# Patient Record
Sex: Male | Born: 1960 | Race: White | Hispanic: No | Marital: Married
Health system: Southern US, Community
[De-identification: ages and names within clinical notes are randomized; demographics above are authoritative.]

---

## 2007-01-11 ENCOUNTER — Emergency Department: Payer: Self-pay | Admitting: Emergency Medicine

## 2007-12-24 ENCOUNTER — Ambulatory Visit: Payer: Self-pay | Admitting: Family Medicine

## 2008-05-16 ENCOUNTER — Emergency Department: Payer: Self-pay | Admitting: Emergency Medicine

## 2010-03-07 ENCOUNTER — Ambulatory Visit: Payer: Self-pay | Admitting: Internal Medicine

## 2010-03-27 ENCOUNTER — Ambulatory Visit: Payer: Self-pay | Admitting: Surgery

## 2012-05-22 ENCOUNTER — Emergency Department: Payer: Self-pay | Admitting: Emergency Medicine

## 2012-05-22 LAB — URINALYSIS, COMPLETE
Bacteria: NONE SEEN
Bilirubin,UR: NEGATIVE
Blood: NEGATIVE
Glucose,UR: NEGATIVE mg/dL (ref 0–75)
Ketone: NEGATIVE
Leukocyte Esterase: NEGATIVE
Nitrite: NEGATIVE
Ph: 5 (ref 4.5–8.0)
Protein: NEGATIVE
RBC,UR: 2 /HPF (ref 0–5)
Specific Gravity: 1.03 (ref 1.003–1.030)
Squamous Epithelial: NONE SEEN
WBC UR: 1 /HPF (ref 0–5)

## 2012-06-28 ENCOUNTER — Ambulatory Visit: Payer: Self-pay | Admitting: Surgery

## 2012-06-28 LAB — HEMOGLOBIN: HGB: 14.8 g/dL (ref 13.0–18.0)

## 2012-06-29 LAB — CBC WITH DIFFERENTIAL/PLATELET
Basophil #: 0 10*3/uL (ref 0.0–0.1)
Eosinophil #: 0 10*3/uL (ref 0.0–0.7)
HCT: 34 % — ABNORMAL LOW (ref 40.0–52.0)
HGB: 11.9 g/dL — ABNORMAL LOW (ref 13.0–18.0)
Lymphocyte #: 0.8 10*3/uL — ABNORMAL LOW (ref 1.0–3.6)
Lymphocyte %: 6.6 %
MCH: 29.5 pg (ref 26.0–34.0)
MCHC: 35.1 g/dL (ref 32.0–36.0)
Monocyte %: 6.3 %
Neutrophil %: 87 %
Platelet: 181 10*3/uL (ref 150–440)
RBC: 4.05 10*6/uL — ABNORMAL LOW (ref 4.40–5.90)

## 2012-09-22 IMAGING — CT CT CERVICAL SPINE WITHOUT CONTRAST
1 series · 12 of 14 positions shown, 15 images · non-contrast
Comparison: none

REASON FOR EXAM: head injury
COMMENTS:

[Series 5: axial · axial · 0.33mm/px · z∈[-286,-137]mm · 12 of 89 slices shown, 15 images]
[im 7/89  soft-tissue]
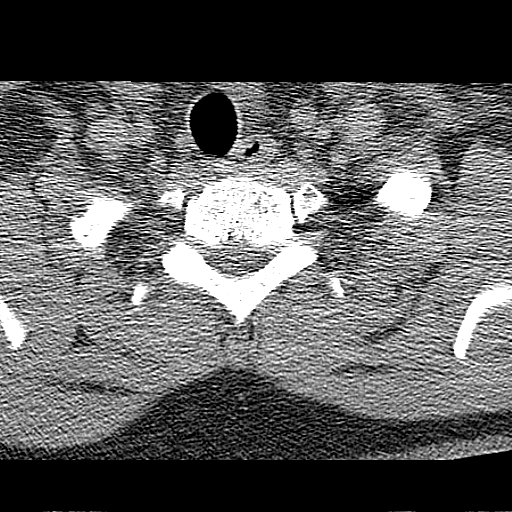
[im 7/89  bone]
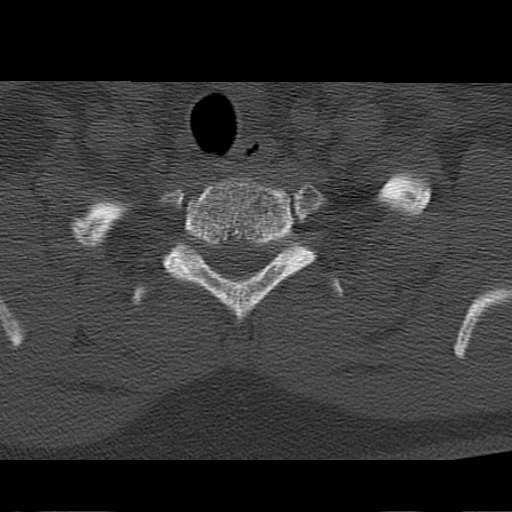
[im 14/89  bone]
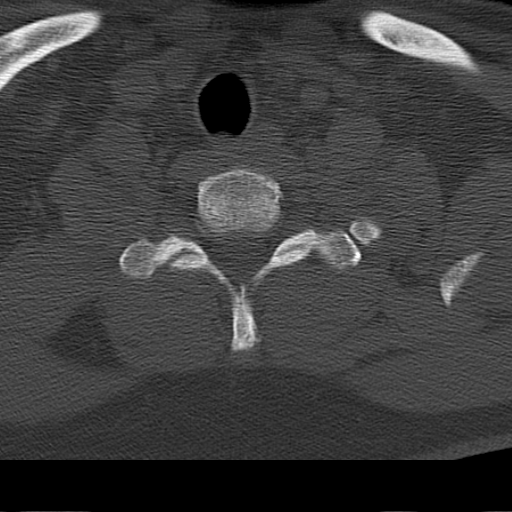
[im 21/89  bone]
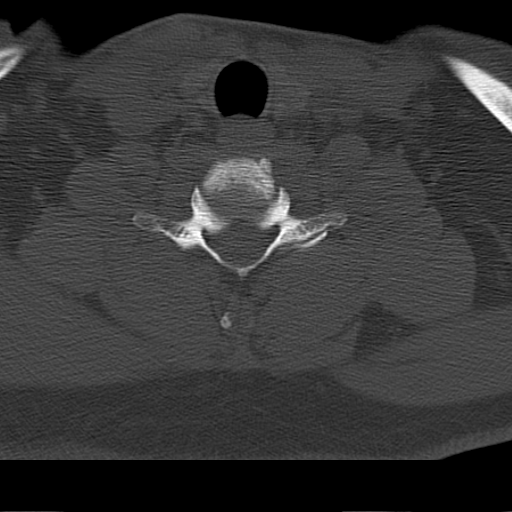
[im 28/89  bone]
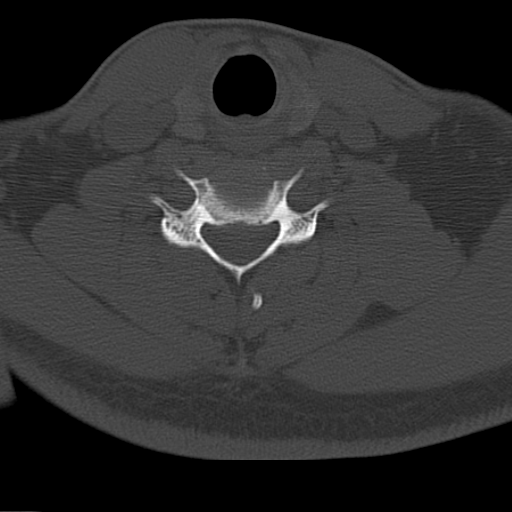
[im 34/89  soft-tissue]
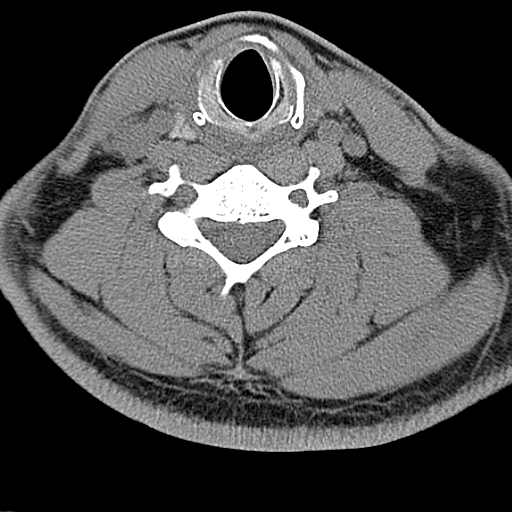
[im 34/89  bone]
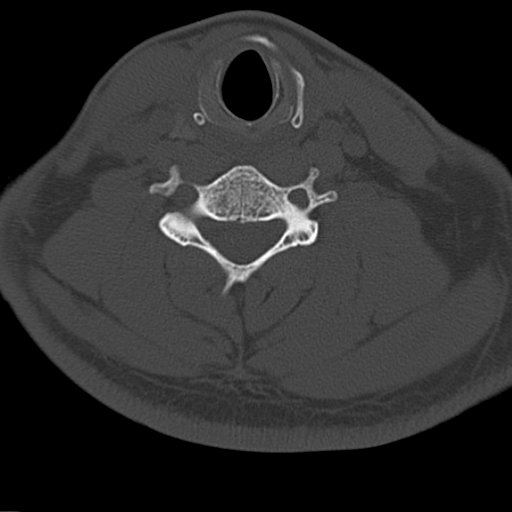
[im 41/89  bone]
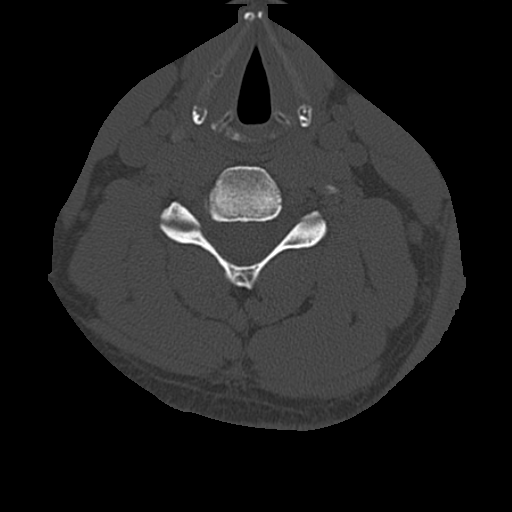
[im 48/89  bone]
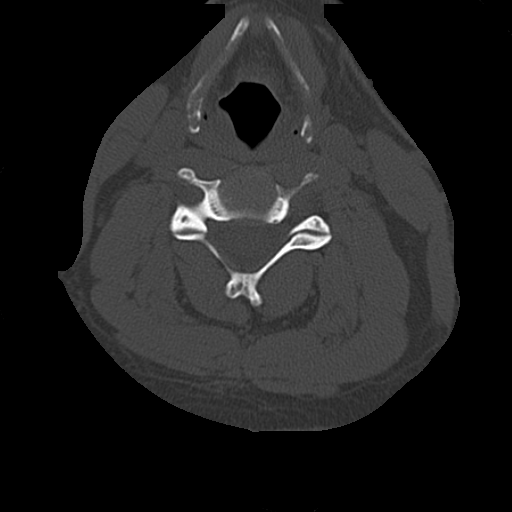
[im 55/89  bone]
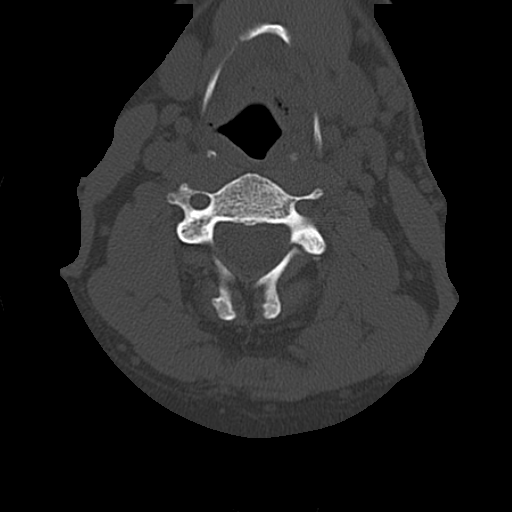
[im 61/89  soft-tissue]
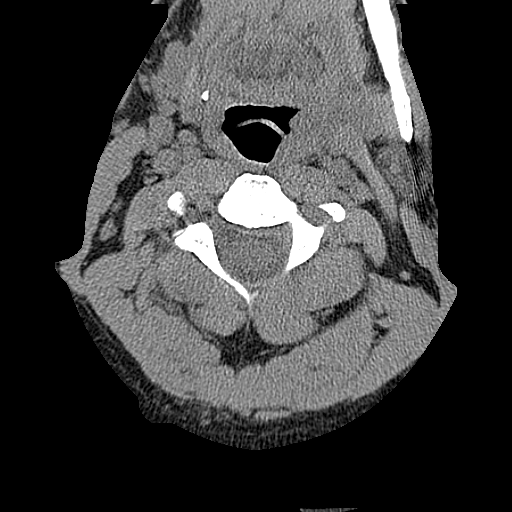
[im 61/89  bone]
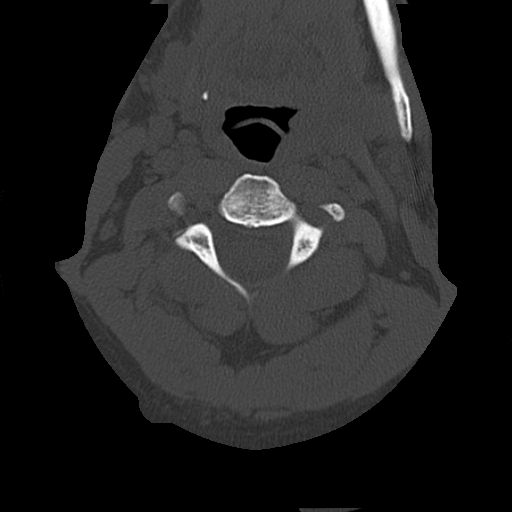
[im 68/89  bone]
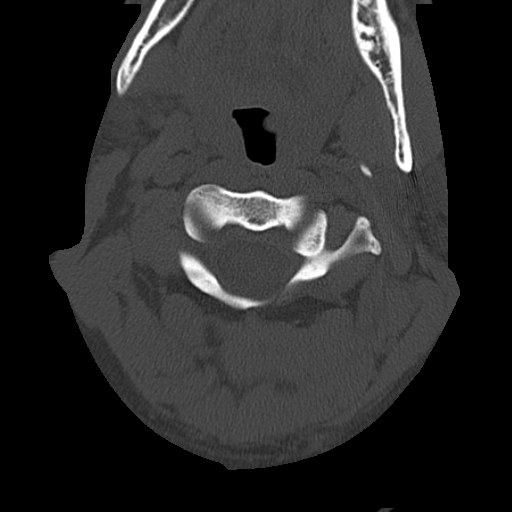
[im 75/89  bone]
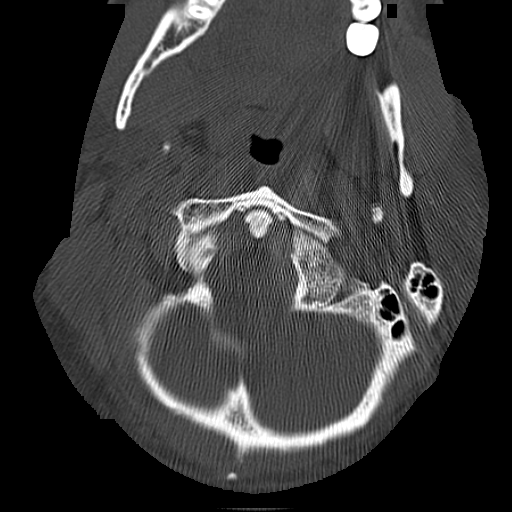
[im 82/89  bone]
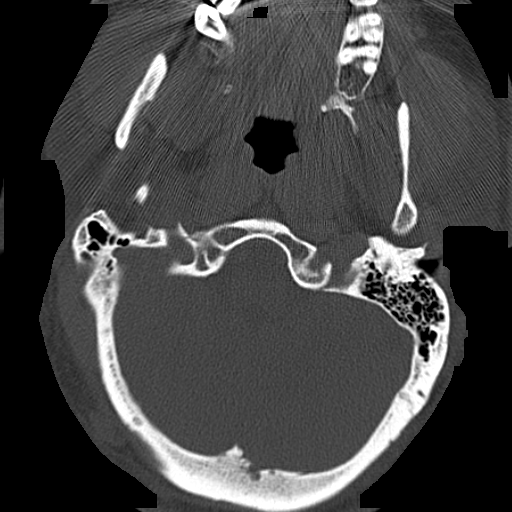

[12 of 14 positions shown; findings below may reference images not displayed]

PROCEDURE:     CT  - CT CERVICAL SPINE WO  - June 28, 2012  [DATE]

RESULT:     Multislice helical acquisition through the cervical spine is
reconstructed in the axial, coronal and sagittal planes at bone window
settings at 2.0 mm slice thickness. The patient has no previous exam for
comparison.

The spinal alignment is normal. The prevertebral soft tissues are normal.
There is no evidence of fracture or subluxation.
IMPRESSION: No CT evidence of acute cervical spine bony abnormality.

[REDACTED]

## 2013-02-06 ENCOUNTER — Emergency Department: Payer: Self-pay | Admitting: Emergency Medicine

## 2014-06-26 ENCOUNTER — Ambulatory Visit: Payer: Self-pay | Admitting: Unknown Physician Specialty

## 2014-07-03 LAB — PATHOLOGY REPORT

## 2015-03-06 NOTE — Op Note (Signed)
PATIENT NAME:  Shawn Potter, Shawn Potter MR#:  [id number] DATE OF BIRTH:  1961/04/18  DATE OF PROCEDURE:  06/28/2012  PREOPERATIVE DIAGNOSIS: Complex scalp laceration with hematoma.   POSTOPERATIVE DIAGNOSIS: Complex scalp laceration with hematoma.   PROCEDURES PERFORMED: Evacuation of hematoma, complex repair of scalp laceration greater than 10 cm in size, and pulsed lavage irrigation.   SURGEON: Natale LayMark Danica Camarena, M.D.   ASSISTANT: Wallace KellerLauren Weinberg, PA Student  TYPE OF ANESTHESIA: General oral endotracheal.   FINDINGS: There was a complex stellate laceration on the occiput of the scalp which had attempted previous repair in the emergency room earlier today resulting in a large subcutaneous hematoma. There was a small visible vein which was cauterized and suture ligated at its base. Hemostasis was subsequently obtained.   DESCRIPTION OF PROCEDURE: With the patient in the supine position, general oral endotracheal anesthesia was induced. The patient was then positioned and padded in lateral decubitus position, left side down, with help of axillary roll and airplane of flank of right arm. Beanbag was used. The existing dressing was removed. The wound was cleansed with sterile saline. The hair was generously clipped around the edges of the wound. The wound was then prepped and draped with Betadine solution with perioperative antibiotics being administered.   Time out was observed.   The existing sutures numbering at least three to four were taken with suture scissors. A large hematoma was evacuated. The wound was then irrigated. Point hemostasis was obtained. A total of 1 liter of lactated Ringer's on pulsed lavage irrigation was used to debride the wound. Skin edges were debrided sharply of devitalized tissue at the center of the stellate laceration. The flaps were then closed to the galea utilizing interrupted 2-0 Vicryl suture. Surgiflo with thrombin application was used in addition to pressure and cautery for  hemostasis. The wound was then closed in layered fashion with 3-0 and 2-0 Vicryl interrupted in the deep dermal layer attaching the dermis down to the galeal layers. Skin edges were then reapproximated utilizing a combination of vertical mattress and simple suture of 3-0 nylon along with a skin staple applier. A sterile dressing was then applied consisting of the remaining Surgiflo along the skin edges followed by 4 x 4's and Kerlix. The patient was then returned supine, extubated and taken to the recovery room in stable and satisfactory condition by anesthesia services. ____________________________ Redge GainerMark A. Egbert GaribaldiBird, MD mab:slb D: 06/28/2012 15:03:57 ET T: 06/28/2012 15:13:12 ET JOB#: [id number]  cc: Loraine LericheMark A. Egbert GaribaldiBird, MD, <Dictator> Reola MosherAndrew S. Randa LynnLamb, MD Ayvin Lipinski Kela MillinA Lakeasha Petion MD ELECTRONICALLY SIGNED 07/07/2012 15:03

## 2015-03-06 NOTE — Consult Note (Signed)
PATIENT NAME:  Shawn Potter, [name] MR#:  [id number] DATE OF BIRTH:  03-08-1961  DATE OF CONSULTATION:  06/28/2012  REFERRING PHYSICIAN:   CONSULTING PHYSICIAN:  Talin Feister A. Egbert GaribaldiBird, MD  HISTORY OF PRESENT ILLNESS: 54 year old white male involved in racquetball event earlier this morning fell backwards sustaining large complex laceration to his occiput. Brought by EMS to the Emergency Room. Hemodynamically stable. No loss of consciousness. CT scan reportedly is negative. Attempts at repair in the Emergency Room were unsuccessful due to continued hemorrhage. For this reason surgical services were consulted. Patient takes no blood thinners.   ALLERGIES: Penicillin.   MEDICATIONS: Diazepam and ibuprofen.   PAST MEDICAL HISTORY: Cholelithiasis.   PAST SURGICAL HISTORY:  1. Laparoscopic cholecystectomy in May 2011. 2. Knee surgery.   SOCIAL HISTORY: Does not smoke. Drinks occasionally. No heart or lung issues.   REVIEW OF SYSTEMS: As described above.   PHYSICAL EXAMINATION:  GENERAL: The patient was alert and oriented with his wife at bedside, cooperative.   VITAL SIGNS: Temperature 97.4, pulse 82, respiratory rate 18, blood pressure 157/86.   HEENT: There is a large amount of clotted blood in and around his head and neck with a large bandage which I will remove and describe below.   LUNGS: Clear.   HEART: Regular rate and rhythm.   NEUROLOGIC: Normal.   SCALP: Examination of his scalp demonstrates a 4 to 5 cm stellate laceration with underlying hematoma. There is a large amount of hematoma in the bandage. The bandage was replaced.   LABORATORY, DIAGNOSTIC AND RADIOLOGICAL DATA:  Hemoglobin 14.6 at 12:15.   IMPRESSION: Large scalp hematoma with unsuccessful closure.   PLAN: Bring the patient to the Operating Room, general anesthesia. Debride the wound. Drain the hematoma. Complex closure likely with drain in place. Discussed this with him and his wife and they are in agreement.   TOTAL TIME  SPENT: 35 minutes.  ____________________________ Redge GainerMark A. Egbert GaribaldiBird, MD mab:cms D: 06/28/2012 12:52:52 ET T: 06/28/2012 13:07:11 ET JOB#: [id number]  cc: Loraine LericheMark A. Egbert GaribaldiBird, MD, <Dictator>   Raynald KempMARK A Lara Palinkas MD ELECTRONICALLY SIGNED 07/07/2012 15:03

## 2021-08-07 ENCOUNTER — Other Ambulatory Visit: Payer: Self-pay

## 2021-08-07 ENCOUNTER — Ambulatory Visit: Payer: Self-pay

## 2021-08-07 DIAGNOSIS — Z23 Encounter for immunization: Secondary | ICD-10-CM

## 2023-04-17 ENCOUNTER — Ambulatory Visit (INDEPENDENT_AMBULATORY_CARE_PROVIDER_SITE_OTHER): Payer: BC Managed Care – PPO

## 2023-04-17 DIAGNOSIS — Z86010 Personal history of colonic polyps: Secondary | ICD-10-CM

## 2023-04-17 DIAGNOSIS — K640 First degree hemorrhoids: Secondary | ICD-10-CM

## 2023-04-17 DIAGNOSIS — Z09 Encounter for follow-up examination after completed treatment for conditions other than malignant neoplasm: Secondary | ICD-10-CM
